# Patient Record
Sex: Female | Born: 1955 | Race: White | Hispanic: No | Marital: Married
Health system: Southern US, Community
[De-identification: ages and names within clinical notes are randomized; demographics above are authoritative.]

---

## 2008-05-25 ENCOUNTER — Encounter (INDEPENDENT_AMBULATORY_CARE_PROVIDER_SITE_OTHER): Payer: Self-pay | Admitting: Diagnostic Radiology

## 2008-05-25 ENCOUNTER — Encounter: Admission: RE | Admit: 2008-05-25 | Discharge: 2008-05-25 | Payer: Self-pay | Admitting: General Surgery

## 2008-05-29 ENCOUNTER — Ambulatory Visit (HOSPITAL_COMMUNITY): Admission: RE | Admit: 2008-05-29 | Discharge: 2008-05-29 | Payer: Self-pay | Admitting: General Surgery

## 2008-05-29 DIAGNOSIS — C50919 Malignant neoplasm of unspecified site of unspecified female breast: Secondary | ICD-10-CM

## 2008-05-29 HISTORY — DX: Malignant neoplasm of unspecified site of unspecified female breast: C50.919

## 2008-07-27 ENCOUNTER — Ambulatory Visit: Payer: Self-pay | Admitting: Genetic Counselor

## 2009-04-27 ENCOUNTER — Encounter: Admission: RE | Admit: 2009-04-27 | Discharge: 2009-04-27 | Payer: Self-pay | Admitting: Hematology and Oncology

## 2010-04-28 ENCOUNTER — Encounter: Admission: RE | Admit: 2010-04-28 | Discharge: 2010-04-28 | Payer: Self-pay | Admitting: Hematology and Oncology

## 2010-09-11 ENCOUNTER — Encounter: Payer: Self-pay | Admitting: General Surgery

## 2010-11-10 ENCOUNTER — Other Ambulatory Visit: Payer: Self-pay | Admitting: Hematology and Oncology

## 2010-11-10 DIAGNOSIS — Z853 Personal history of malignant neoplasm of breast: Secondary | ICD-10-CM

## 2010-11-10 DIAGNOSIS — Z9012 Acquired absence of left breast and nipple: Secondary | ICD-10-CM

## 2011-05-01 ENCOUNTER — Ambulatory Visit
Admission: RE | Admit: 2011-05-01 | Discharge: 2011-05-01 | Disposition: A | Discharge status: Home / Self Care | Payer: BC Managed Care – PPO | Source: Ambulatory Visit | Attending: Hematology and Oncology | Admitting: Hematology and Oncology

## 2011-05-01 DIAGNOSIS — Z853 Personal history of malignant neoplasm of breast: Secondary | ICD-10-CM

## 2011-05-01 DIAGNOSIS — Z9012 Acquired absence of left breast and nipple: Secondary | ICD-10-CM

## 2011-05-15 ENCOUNTER — Other Ambulatory Visit: Payer: Self-pay | Admitting: Hematology and Oncology

## 2011-05-15 DIAGNOSIS — Z9012 Acquired absence of left breast and nipple: Secondary | ICD-10-CM

## 2011-05-15 DIAGNOSIS — Z1231 Encounter for screening mammogram for malignant neoplasm of breast: Secondary | ICD-10-CM

## 2012-05-02 ENCOUNTER — Ambulatory Visit
Admission: RE | Admit: 2012-05-02 | Discharge: 2012-05-02 | Disposition: A | Discharge status: Home / Self Care | Payer: BC Managed Care – PPO | Source: Ambulatory Visit | Attending: Hematology and Oncology | Admitting: Hematology and Oncology

## 2012-05-02 DIAGNOSIS — Z9012 Acquired absence of left breast and nipple: Secondary | ICD-10-CM

## 2012-05-02 DIAGNOSIS — Z1231 Encounter for screening mammogram for malignant neoplasm of breast: Secondary | ICD-10-CM

## 2012-05-07 ENCOUNTER — Encounter: Payer: BC Managed Care – PPO | Admitting: Hematology and Oncology

## 2012-05-07 DIAGNOSIS — M899 Disorder of bone, unspecified: Secondary | ICD-10-CM

## 2012-05-07 DIAGNOSIS — C187 Malignant neoplasm of sigmoid colon: Secondary | ICD-10-CM

## 2012-05-07 DIAGNOSIS — D0590 Unspecified type of carcinoma in situ of unspecified breast: Secondary | ICD-10-CM

## 2012-05-07 DIAGNOSIS — I10 Essential (primary) hypertension: Secondary | ICD-10-CM

## 2012-05-07 DIAGNOSIS — M949 Disorder of cartilage, unspecified: Secondary | ICD-10-CM

## 2012-05-08 ENCOUNTER — Other Ambulatory Visit: Payer: Self-pay | Admitting: Hematology and Oncology

## 2012-05-08 DIAGNOSIS — Z1231 Encounter for screening mammogram for malignant neoplasm of breast: Secondary | ICD-10-CM

## 2013-05-05 ENCOUNTER — Ambulatory Visit
Admission: RE | Admit: 2013-05-05 | Discharge: 2013-05-05 | Disposition: A | Discharge status: Home / Self Care | Payer: BC Managed Care – PPO | Source: Ambulatory Visit | Attending: Hematology and Oncology | Admitting: Hematology and Oncology

## 2013-05-05 DIAGNOSIS — Z1231 Encounter for screening mammogram for malignant neoplasm of breast: Secondary | ICD-10-CM

## 2014-06-16 ENCOUNTER — Other Ambulatory Visit: Payer: Self-pay

## 2014-06-16 DIAGNOSIS — Z1231 Encounter for screening mammogram for malignant neoplasm of breast: Secondary | ICD-10-CM

## 2014-07-02 ENCOUNTER — Ambulatory Visit: Payer: BC Managed Care – PPO

## 2014-07-28 ENCOUNTER — Ambulatory Visit
Admission: RE | Admit: 2014-07-28 | Discharge: 2014-07-28 | Disposition: A | Discharge status: Home / Self Care | Payer: BC Managed Care – PPO | Source: Ambulatory Visit

## 2014-07-28 DIAGNOSIS — Z1231 Encounter for screening mammogram for malignant neoplasm of breast: Secondary | ICD-10-CM

## 2015-08-02 ENCOUNTER — Other Ambulatory Visit: Payer: Self-pay

## 2015-08-02 DIAGNOSIS — Z9012 Acquired absence of left breast and nipple: Secondary | ICD-10-CM

## 2015-08-02 DIAGNOSIS — Z1231 Encounter for screening mammogram for malignant neoplasm of breast: Secondary | ICD-10-CM

## 2015-08-05 ENCOUNTER — Ambulatory Visit
Admission: RE | Admit: 2015-08-05 | Discharge: 2015-08-05 | Disposition: A | Discharge status: Home / Self Care | Payer: BLUE CROSS/BLUE SHIELD | Source: Ambulatory Visit

## 2015-08-05 DIAGNOSIS — Z1231 Encounter for screening mammogram for malignant neoplasm of breast: Secondary | ICD-10-CM

## 2015-08-05 DIAGNOSIS — Z9012 Acquired absence of left breast and nipple: Secondary | ICD-10-CM

## 2016-07-17 ENCOUNTER — Other Ambulatory Visit: Payer: Self-pay | Admitting: Internal Medicine

## 2016-07-17 DIAGNOSIS — Z1231 Encounter for screening mammogram for malignant neoplasm of breast: Secondary | ICD-10-CM

## 2016-08-23 ENCOUNTER — Ambulatory Visit
Admission: RE | Admit: 2016-08-23 | Discharge: 2016-08-23 | Disposition: A | Discharge status: Home / Self Care | Payer: BLUE CROSS/BLUE SHIELD | Source: Ambulatory Visit | Attending: Internal Medicine | Admitting: Internal Medicine

## 2016-08-23 DIAGNOSIS — Z1231 Encounter for screening mammogram for malignant neoplasm of breast: Secondary | ICD-10-CM

## 2017-09-26 ENCOUNTER — Other Ambulatory Visit: Payer: Self-pay | Admitting: Internal Medicine

## 2017-09-26 DIAGNOSIS — Z1231 Encounter for screening mammogram for malignant neoplasm of breast: Secondary | ICD-10-CM

## 2017-10-15 ENCOUNTER — Ambulatory Visit
Admission: RE | Admit: 2017-10-15 | Discharge: 2017-10-15 | Disposition: A | Discharge status: Home / Self Care | Payer: BLUE CROSS/BLUE SHIELD | Source: Ambulatory Visit | Attending: Internal Medicine | Admitting: Internal Medicine

## 2017-10-15 DIAGNOSIS — Z1231 Encounter for screening mammogram for malignant neoplasm of breast: Secondary | ICD-10-CM

## 2018-10-02 ENCOUNTER — Other Ambulatory Visit: Payer: Self-pay | Admitting: Internal Medicine

## 2018-10-02 DIAGNOSIS — Z1231 Encounter for screening mammogram for malignant neoplasm of breast: Secondary | ICD-10-CM

## 2018-10-16 ENCOUNTER — Ambulatory Visit
Admission: RE | Admit: 2018-10-16 | Discharge: 2018-10-16 | Disposition: A | Discharge status: Home / Self Care | Payer: BLUE CROSS/BLUE SHIELD | Source: Ambulatory Visit | Attending: Internal Medicine | Admitting: Internal Medicine

## 2018-10-16 DIAGNOSIS — Z1231 Encounter for screening mammogram for malignant neoplasm of breast: Secondary | ICD-10-CM

## 2019-10-15 ENCOUNTER — Other Ambulatory Visit: Payer: Self-pay | Admitting: Internal Medicine

## 2019-10-15 DIAGNOSIS — Z1231 Encounter for screening mammogram for malignant neoplasm of breast: Secondary | ICD-10-CM

## 2019-10-16 ENCOUNTER — Ambulatory Visit: Payer: BLUE CROSS/BLUE SHIELD

## 2019-10-20 ENCOUNTER — Other Ambulatory Visit: Payer: Self-pay

## 2019-10-20 ENCOUNTER — Ambulatory Visit
Admission: RE | Admit: 2019-10-20 | Discharge: 2019-10-20 | Disposition: A | Discharge status: Home / Self Care | Payer: BC Managed Care – PPO | Source: Ambulatory Visit | Attending: Internal Medicine | Admitting: Internal Medicine

## 2019-10-20 DIAGNOSIS — Z1231 Encounter for screening mammogram for malignant neoplasm of breast: Secondary | ICD-10-CM

## 2019-10-25 ENCOUNTER — Other Ambulatory Visit: Payer: Self-pay

## 2019-10-25 ENCOUNTER — Ambulatory Visit: Payer: BC Managed Care – PPO | Attending: Internal Medicine

## 2019-10-25 DIAGNOSIS — Z23 Encounter for immunization: Secondary | ICD-10-CM | POA: Insufficient documentation

## 2019-10-25 NOTE — Progress Notes (Signed)
   Covid-19 Vaccination Clinic  Name:  Nermeen Wiles    MRN: [id number] DOB: 1956-08-20  10/25/2019  Ms. Billig was observed post Covid-19 immunization for 15 minutes without incident. She was provided with Vaccine Information Sheet and instruction to access the V-Safe system.   Ms. Demark was instructed to call 911 with any severe reactions post vaccine: Marland Kitchen Difficulty breathing  . Swelling of face and throat  . A fast heartbeat  . A bad rash all over body  . Dizziness and weakness   Immunizations Administered    Name Date Dose VIS Date Route   Moderna COVID-19 Vaccine 10/25/2019 10:50 AM 0.5 mL 07/22/2019 Intramuscular   Manufacturer: Moderna   Lot: [id number]   [id number]

## 2019-11-26 ENCOUNTER — Ambulatory Visit: Payer: BC Managed Care – PPO | Attending: Internal Medicine

## 2019-11-26 DIAGNOSIS — Z23 Encounter for immunization: Secondary | ICD-10-CM

## 2019-11-26 NOTE — Progress Notes (Signed)
   Covid-19 Vaccination Clinic  Name:  Mahari Boshears    MRN: [id number] DOB: 1956-03-25  11/26/2019  Ms. Keister was observed post Covid-19 immunization for 15 minutes without incident. She was provided with Vaccine Information Sheet and instruction to access the V-Safe system.   Ms. Prokosch was instructed to call 911 with any severe reactions post vaccine: Marland Kitchen Difficulty breathing  . Swelling of face and throat  . A fast heartbeat  . A bad rash all over body  . Dizziness and weakness   Immunizations Administered    Name Date Dose VIS Date Route   Moderna COVID-19 Vaccine 11/26/2019  2:13 PM 0.5 mL 07/22/2019 Intramuscular   Manufacturer: Levan Hurst   LotUD:[id number]   [place]:[id number]

## 2020-02-22 IMAGING — MG DIGITAL SCREENING UNILATERAL RIGHT MAMMOGRAM WITH CAD AND TOMO
4 series · 4 of 12 positions shown · non-contrast
Comparison: Previous exam(s).

CLINICAL DATA: Screening.

EXAM:
DIGITAL SCREENING UNILATERAL RIGHT MAMMOGRAM WITH CAD AND TOMO

[R CC synth-2D]
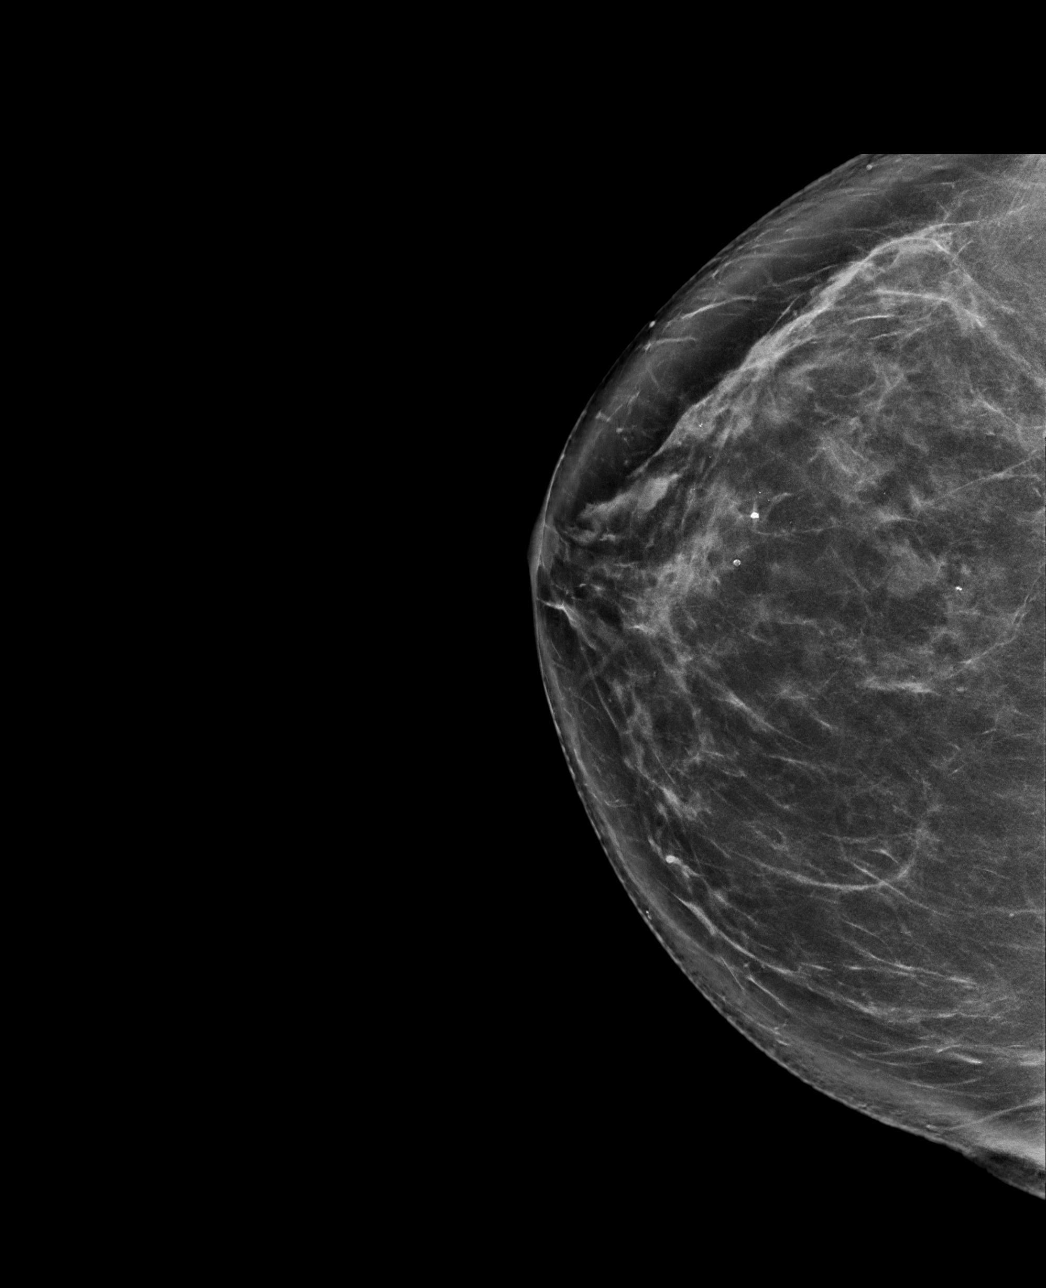

[R MLO synth-2D]
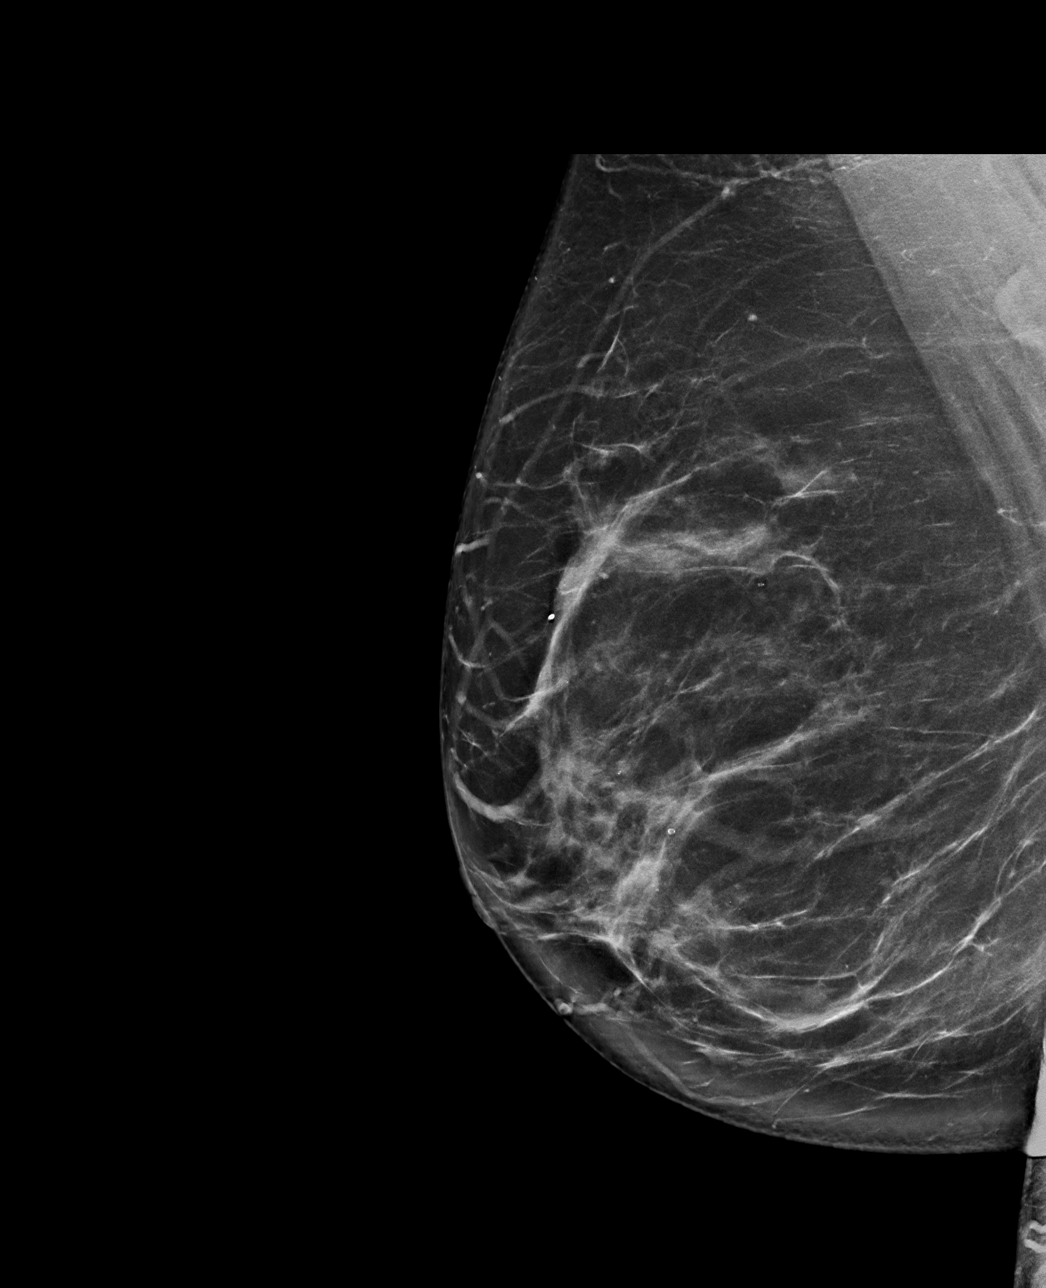

[R MLO tomo · tomo slice 49/97.0]
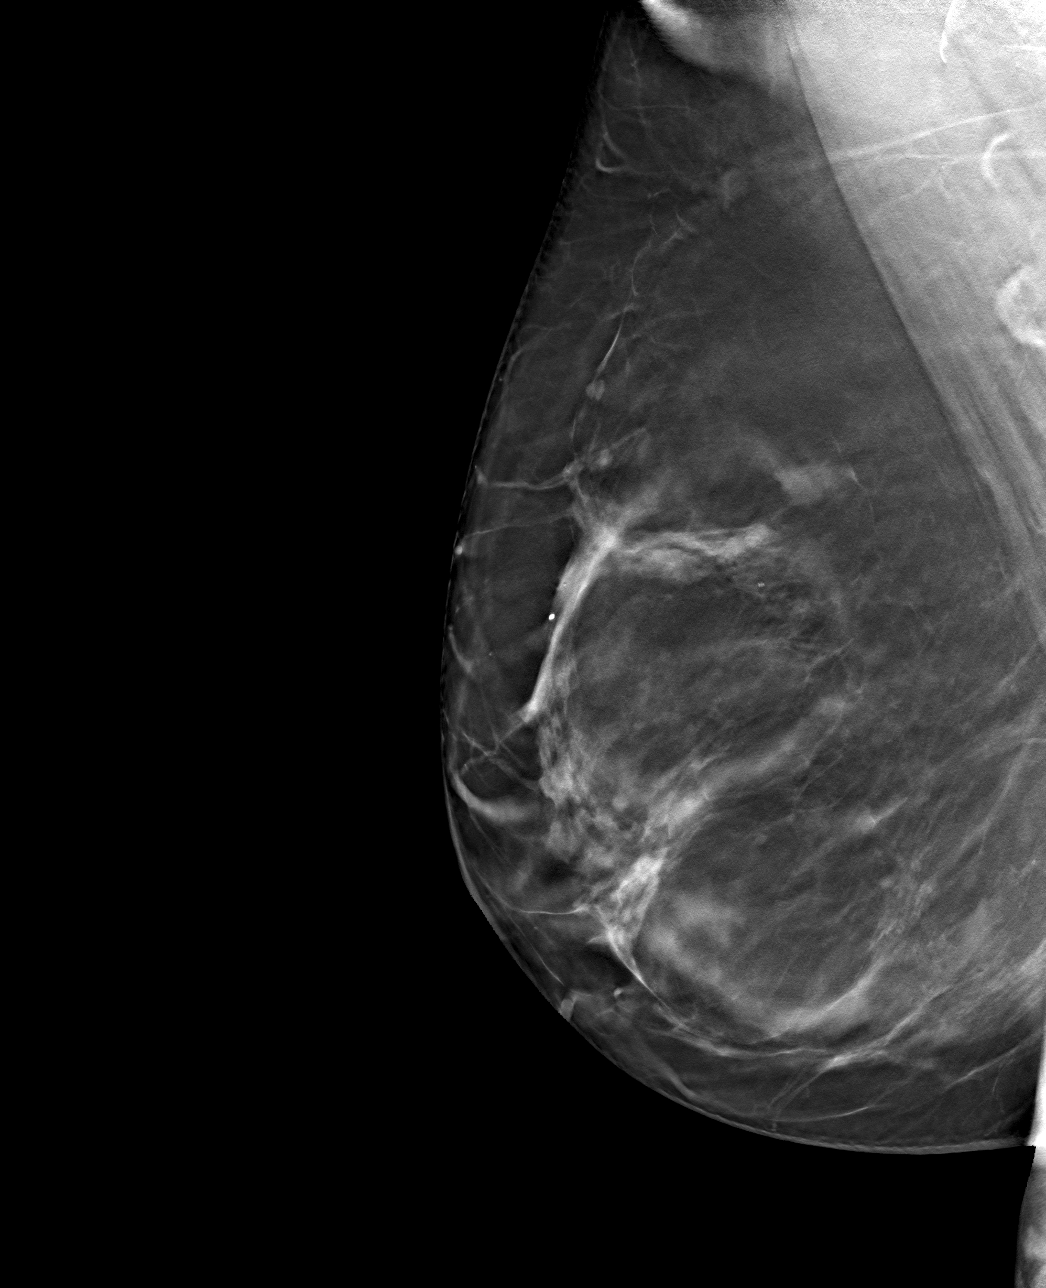

[R CC tomo · tomo slice 48/95.0]
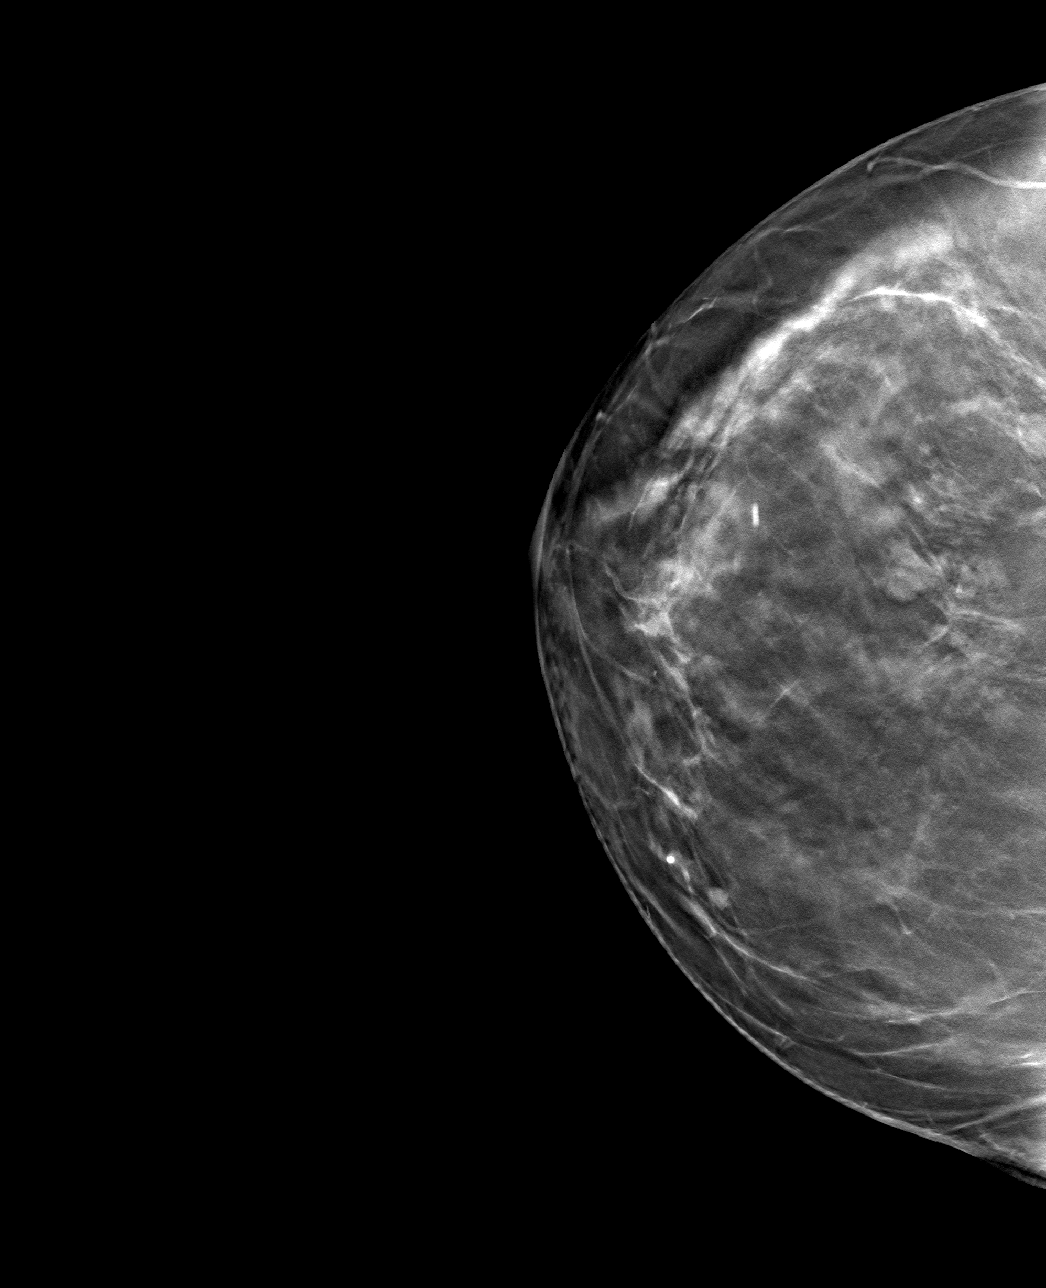

[4 of 12 positions shown; findings below may reference images not displayed]

ACR Breast Density Category c: The breast tissue is heterogeneously
dense, which may obscure small masses.
FINDINGS: There are no findings suspicious for malignancy. Images were
processed with CAD.
IMPRESSION: No mammographic evidence of malignancy. A result letter of this
screening mammogram will be mailed directly to the patient.

RECOMMENDATION:
Screening mammogram in one year. (Code:[id number])

BI-RADS CATEGORY  1: Negative.

## 2020-12-15 ENCOUNTER — Other Ambulatory Visit: Payer: Self-pay | Admitting: Internal Medicine

## 2020-12-15 DIAGNOSIS — Z1231 Encounter for screening mammogram for malignant neoplasm of breast: Secondary | ICD-10-CM

## 2021-02-03 ENCOUNTER — Other Ambulatory Visit: Payer: Self-pay

## 2021-02-03 ENCOUNTER — Ambulatory Visit
Admission: RE | Admit: 2021-02-03 | Discharge: 2021-02-03 | Disposition: A | Discharge status: Home / Self Care | Payer: Medicare Other | Source: Ambulatory Visit | Attending: Internal Medicine | Admitting: Internal Medicine

## 2021-02-03 DIAGNOSIS — Z1231 Encounter for screening mammogram for malignant neoplasm of breast: Secondary | ICD-10-CM

## 2022-01-11 ENCOUNTER — Other Ambulatory Visit: Payer: Self-pay | Admitting: Internal Medicine

## 2022-01-11 DIAGNOSIS — Z1231 Encounter for screening mammogram for malignant neoplasm of breast: Secondary | ICD-10-CM

## 2022-02-06 ENCOUNTER — Ambulatory Visit: Payer: Medicare Other

## 2022-02-13 ENCOUNTER — Ambulatory Visit
Admission: RE | Admit: 2022-02-13 | Discharge: 2022-02-13 | Disposition: A | Discharge status: Home / Self Care | Payer: Medicare Other | Source: Ambulatory Visit | Attending: Internal Medicine | Admitting: Internal Medicine

## 2022-02-13 DIAGNOSIS — Z1231 Encounter for screening mammogram for malignant neoplasm of breast: Secondary | ICD-10-CM

## 2023-02-14 ENCOUNTER — Other Ambulatory Visit: Payer: Self-pay | Admitting: Internal Medicine

## 2023-02-14 DIAGNOSIS — Z1231 Encounter for screening mammogram for malignant neoplasm of breast: Secondary | ICD-10-CM

## 2023-03-13 ENCOUNTER — Ambulatory Visit
Admission: RE | Admit: 2023-03-13 | Discharge: 2023-03-13 | Disposition: A | Discharge status: Home / Self Care | Payer: Medicare Other | Source: Ambulatory Visit | Attending: Internal Medicine | Admitting: Internal Medicine

## 2023-03-13 DIAGNOSIS — Z1231 Encounter for screening mammogram for malignant neoplasm of breast: Secondary | ICD-10-CM

## 2023-03-14 ENCOUNTER — Other Ambulatory Visit: Payer: Self-pay | Admitting: Internal Medicine

## 2023-03-14 DIAGNOSIS — Z1231 Encounter for screening mammogram for malignant neoplasm of breast: Secondary | ICD-10-CM

## 2023-03-19 ENCOUNTER — Other Ambulatory Visit (HOSPITAL_COMMUNITY): Payer: Self-pay | Admitting: Internal Medicine

## 2023-03-19 DIAGNOSIS — Z1231 Encounter for screening mammogram for malignant neoplasm of breast: Secondary | ICD-10-CM

## 2024-02-13 ENCOUNTER — Other Ambulatory Visit (HOSPITAL_COMMUNITY): Payer: Self-pay | Admitting: Internal Medicine

## 2024-02-13 DIAGNOSIS — Z1231 Encounter for screening mammogram for malignant neoplasm of breast: Secondary | ICD-10-CM

## 2024-03-19 ENCOUNTER — Other Ambulatory Visit (HOSPITAL_COMMUNITY): Payer: Self-pay | Admitting: Internal Medicine

## 2024-03-19 ENCOUNTER — Ambulatory Visit (HOSPITAL_COMMUNITY)
Admission: RE | Admit: 2024-03-19 | Discharge: 2024-03-19 | Disposition: A | Discharge status: Home / Self Care | Source: Ambulatory Visit | Attending: Internal Medicine | Admitting: Internal Medicine

## 2024-03-19 DIAGNOSIS — Z1231 Encounter for screening mammogram for malignant neoplasm of breast: Secondary | ICD-10-CM | POA: Insufficient documentation
